# Patient Record
Sex: Female | Born: 1990 | Race: Black or African American | Hispanic: No | Marital: Single | Smoking: Current every day smoker
Health system: Southern US, Community
[De-identification: ages and names within clinical notes are randomized; demographics above are authoritative.]

---

## 2017-06-17 ENCOUNTER — Other Ambulatory Visit: Payer: Self-pay

## 2017-06-17 ENCOUNTER — Encounter (HOSPITAL_COMMUNITY): Payer: Self-pay | Admitting: Emergency Medicine

## 2017-06-17 ENCOUNTER — Emergency Department (HOSPITAL_COMMUNITY): Payer: Self-pay

## 2017-06-17 ENCOUNTER — Emergency Department (HOSPITAL_COMMUNITY)
Admission: EM | Admit: 2017-06-17 | Discharge: 2017-06-17 | Disposition: A | Discharge status: Home / Self Care | Payer: Self-pay | Attending: Emergency Medicine | Admitting: Emergency Medicine

## 2017-06-17 DIAGNOSIS — R102 Pelvic and perineal pain: Secondary | ICD-10-CM | POA: Insufficient documentation

## 2017-06-17 DIAGNOSIS — N83201 Unspecified ovarian cyst, right side: Secondary | ICD-10-CM | POA: Insufficient documentation

## 2017-06-17 DIAGNOSIS — M25551 Pain in right hip: Secondary | ICD-10-CM | POA: Insufficient documentation

## 2017-06-17 DIAGNOSIS — F17210 Nicotine dependence, cigarettes, uncomplicated: Secondary | ICD-10-CM | POA: Insufficient documentation

## 2017-06-17 LAB — WET PREP, GENITAL
SPERM: NONE SEEN
Trich, Wet Prep: NONE SEEN
Yeast Wet Prep HPF POC: NONE SEEN

## 2017-06-17 LAB — URINALYSIS, ROUTINE W REFLEX MICROSCOPIC
BILIRUBIN URINE: NEGATIVE
GLUCOSE, UA: NEGATIVE mg/dL
HGB URINE DIPSTICK: NEGATIVE
Ketones, ur: NEGATIVE mg/dL
Leukocytes, UA: NEGATIVE
Nitrite: NEGATIVE
PH: 6 (ref 5.0–8.0)
Protein, ur: NEGATIVE mg/dL
Specific Gravity, Urine: 1.013 (ref 1.005–1.030)

## 2017-06-17 LAB — CBC WITH DIFFERENTIAL/PLATELET
BASOS ABS: 0.1 10*3/uL (ref 0.0–0.1)
BASOS PCT: 1 %
Eosinophils Absolute: 0.3 10*3/uL (ref 0.0–0.7)
Eosinophils Relative: 4 %
HEMATOCRIT: 37.9 % (ref 36.0–46.0)
HEMOGLOBIN: 12.1 g/dL (ref 12.0–15.0)
LYMPHS PCT: 37 %
Lymphs Abs: 2.5 10*3/uL (ref 0.7–4.0)
MCH: 25.7 pg — ABNORMAL LOW (ref 26.0–34.0)
MCHC: 31.9 g/dL (ref 30.0–36.0)
MCV: 80.6 fL (ref 78.0–100.0)
MONO ABS: 0.6 10*3/uL (ref 0.1–1.0)
Monocytes Relative: 9 %
NEUTROS ABS: 3.2 10*3/uL (ref 1.7–7.7)
NEUTROS PCT: 49 %
Platelets: 181 10*3/uL (ref 150–400)
RBC: 4.7 MIL/uL (ref 3.87–5.11)
RDW: 13.7 % (ref 11.5–15.5)
WBC: 6.6 10*3/uL (ref 4.0–10.5)

## 2017-06-17 LAB — I-STAT CHEM 8, ED
BUN: 7 mg/dL (ref 6–20)
CHLORIDE: 101 mmol/L (ref 101–111)
Calcium, Ion: 1.17 mmol/L (ref 1.15–1.40)
Creatinine, Ser: 0.6 mg/dL (ref 0.44–1.00)
Glucose, Bld: 74 mg/dL (ref 65–99)
HEMATOCRIT: 39 % (ref 36.0–46.0)
HEMOGLOBIN: 13.3 g/dL (ref 12.0–15.0)
POTASSIUM: 3.6 mmol/L (ref 3.5–5.1)
Sodium: 139 mmol/L (ref 135–145)
TCO2: 24 mmol/L (ref 22–32)

## 2017-06-17 LAB — POC URINE PREG, ED: Preg Test, Ur: NEGATIVE

## 2017-06-17 MED ORDER — IBUPROFEN 200 MG PO TABS
600.0000 mg | ORAL_TABLET | Freq: Once | ORAL | Status: AC
  Administered 2017-06-17: 600 mg via ORAL
  Filled 2017-06-17: qty 3

## 2017-06-17 MED ORDER — IBUPROFEN 600 MG PO TABS: 600.0000 mg | ORAL_TABLET | Freq: Four times a day (QID) | ORAL | 0 refills | Status: AC | PRN

## 2017-06-17 NOTE — ED Provider Notes (Signed)
[facility]-EMERGENCY DEPT Provider Note   CSN: [id number] Arrival date & time: 06/17/17  1546     History   Chief Complaint Chief Complaint  Patient presents with  . Hip Pain  . Pelvic Pain    HPI Sarah York is a 27 y.o. female.  HPI   Generally healthy 27 year old female presenting for evaluation of right hip pain.  Patient report for the past several days she has noticed pain to her right hip.  Pain worsening today after she got up.  She described pain as a sharp throbbing sensation, worsening with movement and with walking.  Pain is moderate in severity and nonradiating.  She report sleeping on her sister's couch for the past 2 months and think it may have caused her hip pain.  She denies any specific injury.  Denies any fever, chills, lightheadedness, dizziness, abdominal pain, dysuria, hematuria, vaginal bleeding or vaginal discharge.  She denies any focal numbness or weakness or saddle anesthesia.  She is not pregnant.  She denies any specific treatment tried.  She reports ambulating with a limp.  She denies noting rash  History reviewed. No pertinent past medical history.  There are no active problems to display for this patient.   History reviewed. No pertinent surgical history.   OB History   None      Home Medications    Prior to Admission medications   Not on File    Family History History reviewed. No pertinent family history.  Social History Social History   Tobacco Use  . Smoking status: Current Every Day Smoker    Packs/day: 0.50    Types: Cigarettes  . Smokeless tobacco: Never Used  Substance Use Topics  . Alcohol use: Never    Frequency: Never  . Drug use: Never     Allergies   Patient has no known allergies.   Review of Systems Review of Systems  Constitutional: Negative for fever.  Musculoskeletal: Positive for arthralgias.  Skin: Negative for rash and wound.  Neurological: Negative for numbness.      Physical Exam Updated Vital Signs BP (!) 123/96 (BP Location: Left Arm)   Pulse 87   Temp 97.8 F (36.6 C) (Oral)   Resp 16   LMP 05/28/2017 (Approximate)   SpO2 100%   Physical Exam  Constitutional: She appears well-developed and well-nourished. No distress.  HENT:  Head: Atraumatic.  Eyes: Conjunctivae are normal.  Neck: Neck supple.  Cardiovascular: Normal rate and regular rhythm.  Pulmonary/Chest: Effort normal and breath sounds normal.  Abdominal: Soft. Bowel sounds are normal. She exhibits no distension. There is no tenderness.  Genitourinary:  Genitourinary Comments: Chaperone present during exam. No inguinal lymphadenopathy or hernia noted. Normal external genitalia. Discomfort with speculum insertion.  Moderate amount of vaginal discharge noted in vaginal vault.  Cervical os closed. On bimanual exam, tenderness to R adnexal region.  No CMT.  Musculoskeletal: She exhibits tenderness (Right hip: Tenderness to the greater trochanter and lateral hip on palpation without any obvious deformity, abnormal leg rotation or shortening of the leg.  Hip with full range of motion but with tenderness to movement.  No overlying skin changes.).  No tenderness to midline spine, or right knee.  Neurological: She is alert.  Skin: No rash noted.  Psychiatric: She has a normal mood and affect.  Nursing note and vitals reviewed.    ED Treatments / Results  Labs (all labs ordered are listed, but only abnormal results are displayed) Labs Reviewed  URINALYSIS, ROUTINE W REFLEX MICROSCOPIC  POC URINE PREG, ED    EKG None  Radiology US Transvaginal Non-ob  Result Date: 06/17/2017 CLINICAL DATA:  Right pelvic pain.  Last menstrual period 05/28/2017 EXAM: TRANSABDOMINAL AND TRANSVAGINAL ULTRASOUND OF PELVIS DOPPLER ULTRASOUND OF OVARIES TECHNIQUE: Both transabdominal and transvaginal ultrasound examinations of the pelvis were performed. Transabdominal technique was performed for global  imaging of the pelvis including uterus, ovaries, adnexal regions, and pelvic cul-de-sac. It was necessary to proceed with endovaginal exam following the transabdominal exam to visualize the endometrium and adnexa. Color and duplex Doppler ultrasound was utilized to evaluate blood flow to the ovaries. COMPARISON:  None. FINDINGS: Uterus Measurements: 9.0 x 5.1 x 6.0 cm. No fibroids or other mass visualized. Endometrium Thickness: 16 mm. 3 mm anechoic circumscribed structure noted within the endometrium. Right ovary Measurements: 3.3 x 2.6 x 2.9 cm. There is a 2.3 cm echogenic area with associated cystic component and peripheral vascularity. Left ovary Measurements: 3.2 x 1.2 x 2.0 cm. Normal appearance/no adnexal mass. Pulsed Doppler evaluation of both ovaries demonstrates normal low-resistance arterial and venous waveforms. Other findings No abnormal free fluid. IMPRESSION: Endometrium upper limits of normal in thickness with 3 mm anechoic structure within it. With negative pregnancy test this likely represents a small cyst within the endometrium. Normal appearance of the left ovary. 2.3 cm mixed echogenicity mass on the right ovary with peripheral vascularity. This may represent a degenerating corpus luteal cyst. Follow-up ultrasound in 6-8 weeks is recommended, assuming negative pregnancy test and no chance of ectopic pregnancy. Electronically Signed   By: Ted Mcalpine M.D.   On: 06/17/2017 21:00   US Pelvis Complete  Result Date: 06/17/2017 CLINICAL DATA:  Right pelvic pain.  Last menstrual period 05/28/2017 EXAM: TRANSABDOMINAL AND TRANSVAGINAL ULTRASOUND OF PELVIS DOPPLER ULTRASOUND OF OVARIES TECHNIQUE: Both transabdominal and transvaginal ultrasound examinations of the pelvis were performed. Transabdominal technique was performed for global imaging of the pelvis including uterus, ovaries, adnexal regions, and pelvic cul-de-sac. It was necessary to proceed with endovaginal exam following the  transabdominal exam to visualize the endometrium and adnexa. Color and duplex Doppler ultrasound was utilized to evaluate blood flow to the ovaries. COMPARISON:  None. FINDINGS: Uterus Measurements: 9.0 x 5.1 x 6.0 cm. No fibroids or other mass visualized. Endometrium Thickness: 16 mm. 3 mm anechoic circumscribed structure noted within the endometrium. Right ovary Measurements: 3.3 x 2.6 x 2.9 cm. There is a 2.3 cm echogenic area with associated cystic component and peripheral vascularity. Left ovary Measurements: 3.2 x 1.2 x 2.0 cm. Normal appearance/no adnexal mass. Pulsed Doppler evaluation of both ovaries demonstrates normal low-resistance arterial and venous waveforms. Other findings No abnormal free fluid. IMPRESSION: Endometrium upper limits of normal in thickness with 3 mm anechoic structure within it. With negative pregnancy test this likely represents a small cyst within the endometrium. Normal appearance of the left ovary. 2.3 cm mixed echogenicity mass on the right ovary with peripheral vascularity. This may represent a degenerating corpus luteal cyst. Follow-up ultrasound in 6-8 weeks is recommended, assuming negative pregnancy test and no chance of ectopic pregnancy. Electronically Signed   By: Ted Mcalpine M.D.   On: 06/17/2017 21:00   [place] Art/ven Flow Abd Pelv Doppler  Result Date: 06/17/2017 CLINICAL DATA:  Right pelvic pain.  Last menstrual period 05/28/2017 EXAM: TRANSABDOMINAL AND TRANSVAGINAL ULTRASOUND OF PELVIS DOPPLER ULTRASOUND OF OVARIES TECHNIQUE: Both transabdominal and transvaginal ultrasound examinations of the pelvis were performed. Transabdominal technique was performed for global imaging of the  pelvis including uterus, ovaries, adnexal regions, and pelvic cul-de-sac. It was necessary to proceed with endovaginal exam following the transabdominal exam to visualize the endometrium and adnexa. Color and duplex Doppler ultrasound was utilized to evaluate blood flow to the  ovaries. COMPARISON:  None. FINDINGS: Uterus Measurements: 9.0 x 5.1 x 6.0 cm. No fibroids or other mass visualized. Endometrium Thickness: 16 mm. 3 mm anechoic circumscribed structure noted within the endometrium. Right ovary Measurements: 3.3 x 2.6 x 2.9 cm. There is a 2.3 cm echogenic area with associated cystic component and peripheral vascularity. Left ovary Measurements: 3.2 x 1.2 x 2.0 cm. Normal appearance/no adnexal mass. Pulsed Doppler evaluation of both ovaries demonstrates normal low-resistance arterial and venous waveforms. Other findings No abnormal free fluid. IMPRESSION: Endometrium upper limits of normal in thickness with 3 mm anechoic structure within it. With negative pregnancy test this likely represents a small cyst within the endometrium. Normal appearance of the left ovary. 2.3 cm mixed echogenicity mass on the right ovary with peripheral vascularity. This may represent a degenerating corpus luteal cyst. Follow-up ultrasound in 6-8 weeks is recommended, assuming negative pregnancy test and no chance of ectopic pregnancy. Electronically Signed   By: Ted Mcalpine M.D.   On: 06/17/2017 21:00    Procedures Procedures (including critical care time)  Medications Ordered in ED Medications  ibuprofen (ADVIL,MOTRIN) tablet 600 mg (600 mg Oral Given 06/17/17 1741)     Initial Impression / Assessment and Plan / ED Course  I have reviewed the triage vital signs and the nursing notes.  Pertinent labs & imaging results that were available during my care of the patient were reviewed by me and considered in my medical decision making (see chart for details).     BP 104/74 (BP Location: Left Arm)   Pulse 66   Temp 97.8 F (36.6 C) (Oral)   Resp 18   LMP 05/28/2017 (Approximate)   SpO2 100%    Final Clinical Impressions(s) / ED Diagnoses   Final diagnoses:  Right hip pain  Ovarian cyst, right    ED Discharge Orders        Ordered    ibuprofen (ADVIL,MOTRIN) 600 MG  tablet  Every 6 hours PRN     06/17/17 2126     5:22 PM Patient complaining of right hip pain after sleeping on his sister's couch for the past 2 months.  Pain is reproducible on exam.  No recent injury to suggest fracture or dislocation.  No signs of infection.  6:24 PM Pt received ibuprofen and report improvement.  preg test negative, UA negative, however on reexamination pt has pain to her pelvic region.  Will perform pelvic examination to r/o pelvic pathology.  Pt denies fever, chills, n/v.  6:47 PM R adnexal tenderness on pelvic examination. Moderate vaginal discharge noted.  No CMT.  Will obtain pelvic US to r/o torsion or TOA.   9:20 PM Wet prep shows present of clue cells but without vaginal complaint, I do not think we need to treat for BV.  Normal WBC, preg test negative and UA negative. Pelvic US showing 2.3 cm mixed echogenicity mass on the R ovary with peripheral vascularity.  THis is likely a degenerating corpus luteal cyst.  FOllow up ultrasound in 6-8 weeks is recommended.  I discussed this finding with pt.  Recommend return sooner if no improvement of her symptoms.    Pt is doing much better after receiving ibuprofen and she feels comfortable being discharge.     Laveda Norman,  Kelton Pillar 06/17/17 2128    Wynetta Fines, MD 06/17/17 2138

## 2017-06-17 NOTE — Discharge Instructions (Signed)
You have been evaluated for your right hip pain.  Take ibuprofen as needed for pain.  You have evidence of a corpus luteal cyst on the right ovary.  Please have a repeat ultrasound in 6-8 weeks.  If your condition worsen, please don't hesitate to return to the ER for further evaluation.

## 2017-06-17 NOTE — ED Triage Notes (Signed)
Pt states that she has been sleeping on her sister's couch for 2 months.  States that this morning, she woke up with rt hip pain.  Worse with movement.  Ambulatory with limp to triage room.

## 2017-06-18 LAB — GC/CHLAMYDIA PROBE AMP (~~LOC~~) NOT AT ARMC
Chlamydia: NEGATIVE
NEISSERIA GONORRHEA: NEGATIVE

## 2017-06-18 LAB — RPR: RPR: NONREACTIVE

## 2017-06-18 LAB — HIV ANTIBODY (ROUTINE TESTING W REFLEX): HIV SCREEN 4TH GENERATION: NONREACTIVE

## 2019-01-19 ENCOUNTER — Emergency Department (HOSPITAL_COMMUNITY): Payer: Medicaid Other

## 2019-01-19 ENCOUNTER — Other Ambulatory Visit: Payer: Self-pay

## 2019-01-19 ENCOUNTER — Emergency Department (HOSPITAL_COMMUNITY)
Admission: EM | Admit: 2019-01-19 | Discharge: 2019-01-19 | Disposition: A | Discharge status: Home / Self Care | Payer: Medicaid Other | Attending: Emergency Medicine | Admitting: Emergency Medicine

## 2019-01-19 ENCOUNTER — Encounter (HOSPITAL_COMMUNITY): Payer: Self-pay | Admitting: Emergency Medicine

## 2019-01-19 DIAGNOSIS — Y999 Unspecified external cause status: Secondary | ICD-10-CM | POA: Insufficient documentation

## 2019-01-19 DIAGNOSIS — S8265XA Nondisplaced fracture of lateral malleolus of left fibula, initial encounter for closed fracture: Secondary | ICD-10-CM | POA: Insufficient documentation

## 2019-01-19 DIAGNOSIS — Y929 Unspecified place or not applicable: Secondary | ICD-10-CM | POA: Diagnosis not present

## 2019-01-19 DIAGNOSIS — S99912A Unspecified injury of left ankle, initial encounter: Secondary | ICD-10-CM | POA: Diagnosis present

## 2019-01-19 DIAGNOSIS — Y939 Activity, unspecified: Secondary | ICD-10-CM | POA: Diagnosis not present

## 2019-01-19 DIAGNOSIS — W1830XA Fall on same level, unspecified, initial encounter: Secondary | ICD-10-CM | POA: Diagnosis not present

## 2019-01-19 MED ORDER — TRAMADOL HCL 50 MG PO TABS: 50.0000 mg | ORAL_TABLET | Freq: Four times a day (QID) | ORAL | 0 refills | Status: AC | PRN

## 2019-01-19 MED ORDER — OXYCODONE-ACETAMINOPHEN 5-325 MG PO TABS
1.0000 | ORAL_TABLET | Freq: Once | ORAL | Status: AC
  Administered 2019-01-19: 05:00:00 1 via ORAL
  Filled 2019-01-19: qty 1

## 2019-01-19 MED ORDER — NAPROXEN 250 MG PO TABS
500.0000 mg | ORAL_TABLET | Freq: Once | ORAL | Status: AC
Start: 2019-01-19 — End: 2019-01-19
  Administered 2019-01-19: 500 mg via ORAL
  Filled 2019-01-19: qty 2

## 2019-01-19 MED ORDER — OXYCODONE-ACETAMINOPHEN 5-325 MG PO TABS
1.0000 | ORAL_TABLET | Freq: Once | ORAL | Status: AC
  Administered 2019-01-19: 1 via ORAL
  Filled 2019-01-19: qty 1
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
## 2019-01-19 NOTE — ED Notes (Signed)
Discharge instructions discussed with pt. Pt verbalized understanding. Pt stable and ambulatory. No signature pad available. 

## 2019-01-19 NOTE — Discharge Instructions (Addendum)
You may remove your walking boot to shower, but this should otherwise remain on at all times to ensure proper stabilization of your broken bone. Use crutches when walking to prevent from putting weight on your left foot/leg if this causes you too much pain. Take 600mg  ibuprofen every 6 hours for pain.  You may use Tramadol for severe pain as prescribed.  Do not drive or drink alcohol after taking this medication as it may make you drowsy and impair your judgment.  Any broken bone takes approximately 6 to 8 weeks to completely heal.  You should follow-up with orthopedics to ensure proper healing of your broken bone.  Call in the morning to schedule a follow-up appointment with Dr. Marlou Sa of orthopedics.  You may return to the ED for any new or concerning symptoms.
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
## 2019-01-19 NOTE — Progress Notes (Signed)
Orthopedic Tech Progress Note Patient Details:  Khaniyah Bezek [birth year] [id number]  Ortho Devices Type of Ortho Device: Crutches, CAM walker Ortho Device/Splint Location: LLE Ortho Device/Splint Interventions: Ordered, Application   Post Interventions Patient Tolerated: Well Instructions Provided: Adjustment of device, Care of device, Poper ambulation with device   Staci Righter 01/19/2019, 5:26 AM

## 2019-01-19 NOTE — ED Notes (Signed)
Pt yelling in lobby and stating
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
## 2019-01-19 NOTE — ED Notes (Signed)
Ortho tech at bedside ambulating pt with crutches

## 2019-01-19 NOTE — ED Notes (Signed)
Tech had to remove the pt from the waiting room box in lobby set aside for the respiratory pt's. Informed pt again that she needed to stay put

## 2019-02-08 ENCOUNTER — Other Ambulatory Visit: Payer: Self-pay

## 2019-02-08 ENCOUNTER — Ambulatory Visit (INDEPENDENT_AMBULATORY_CARE_PROVIDER_SITE_OTHER): Payer: Medicaid Other

## 2019-02-08 ENCOUNTER — Encounter: Payer: Self-pay | Admitting: Orthopaedic Surgery

## 2019-02-08 ENCOUNTER — Ambulatory Visit (INDEPENDENT_AMBULATORY_CARE_PROVIDER_SITE_OTHER): Payer: Medicaid Other | Admitting: Orthopaedic Surgery

## 2019-02-08 DIAGNOSIS — S82892A Other fracture of left lower leg, initial encounter for closed fracture: Secondary | ICD-10-CM | POA: Diagnosis not present
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
## 2019-08-20 IMAGING — US US ART/VEN ABD/PELV/SCROTUM DOPPLER LTD
1 series · 13 of 25 positions shown · non-contrast
Comparison: None.

CLINICAL DATA: Right pelvic pain.  Last menstrual period 05/28/2017

EXAM:
TRANSABDOMINAL AND TRANSVAGINAL ULTRASOUND OF PELVIS
DOPPLER ULTRASOUND OF OVARIES
TECHNIQUE: Both transabdominal and transvaginal ultrasound examinations of the
pelvis were performed. Transabdominal technique was performed for
global imaging of the pelvis including uterus, ovaries, adnexal
regions, and pelvic cul-de-sac.
It was necessary to proceed with endovaginal exam following the
transabdominal exam to visualize the endometrium and adnexa. Color
and duplex Doppler ultrasound was utilized to evaluate blood flow to
the ovaries.

[Series 1: us art/ven abd/pelv/scrotum doppler ltd · 104 acquisitions, 13 frames shown]
[im 1/104]
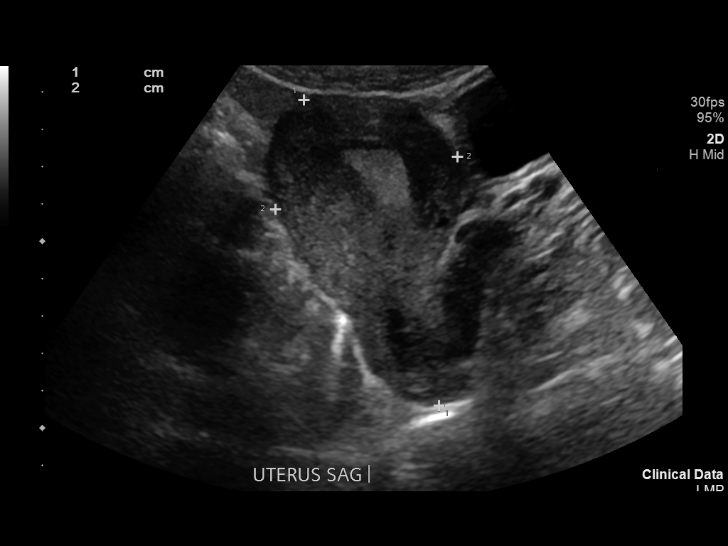
[im 9/104]
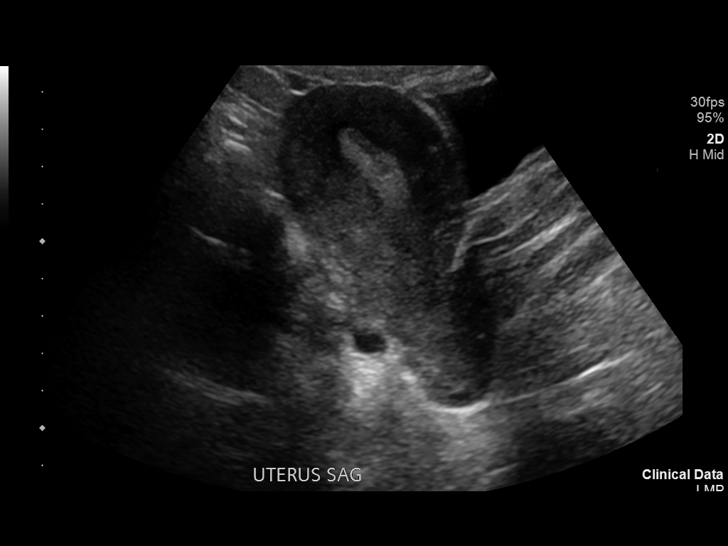
[im 18/104]
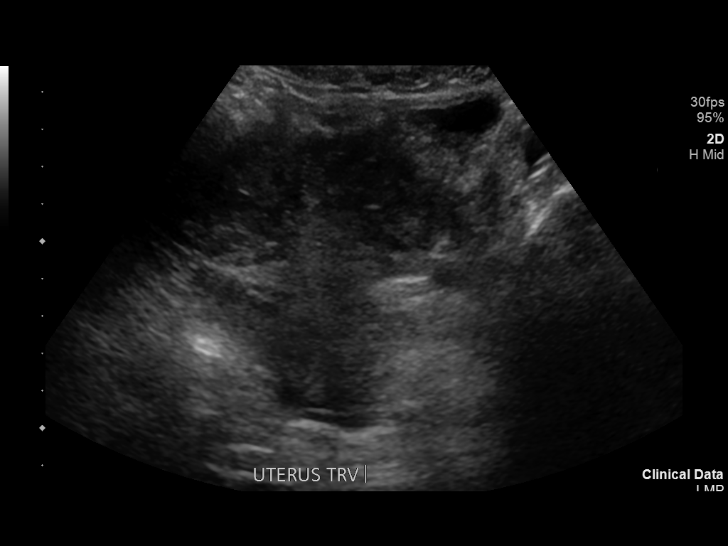
[im 26/104]
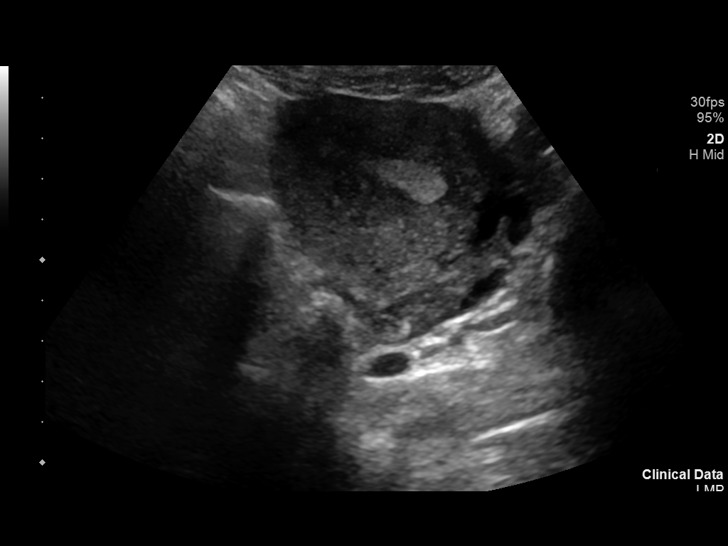
[im 35/104]
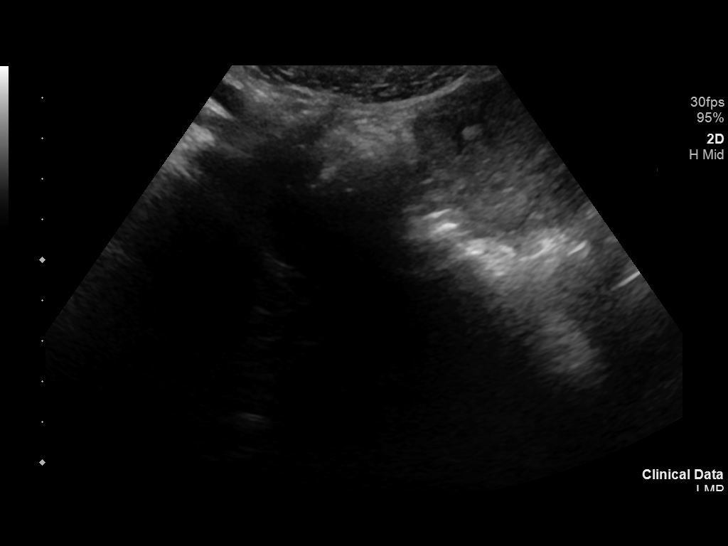
[im 43/104]
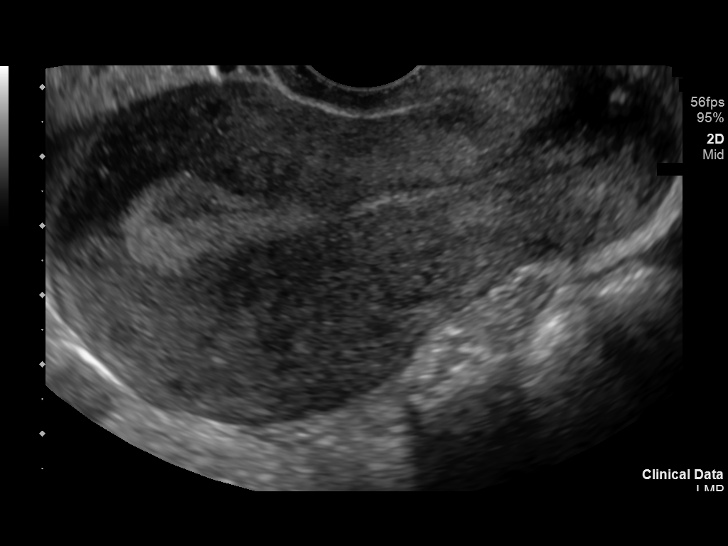
[im 52/104]
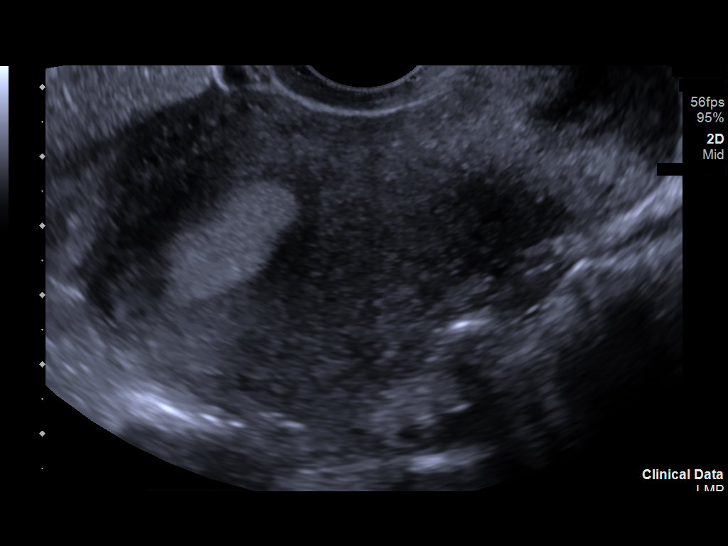
[im 61/104]
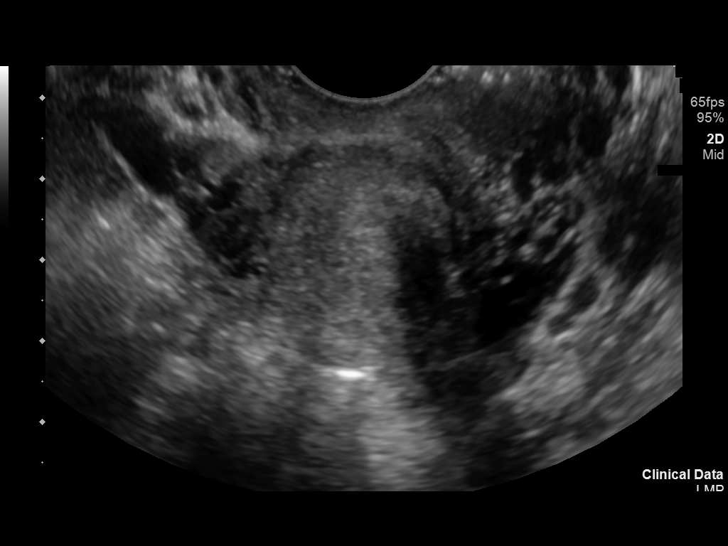
[im 69/104]
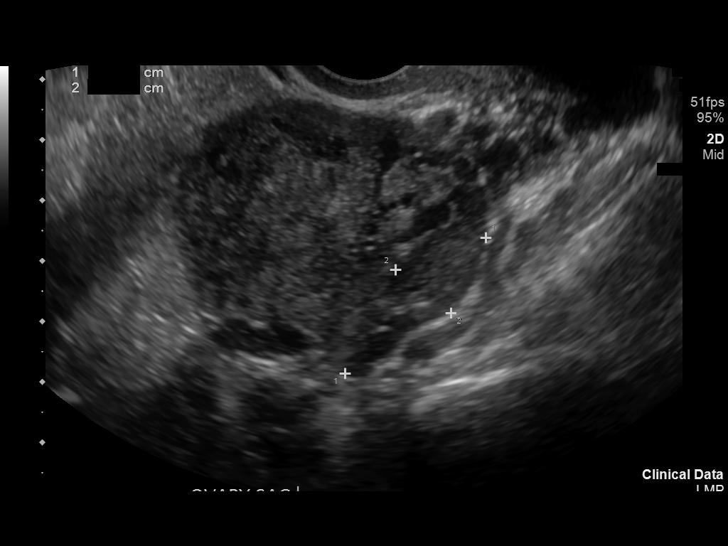
[im 78/104]
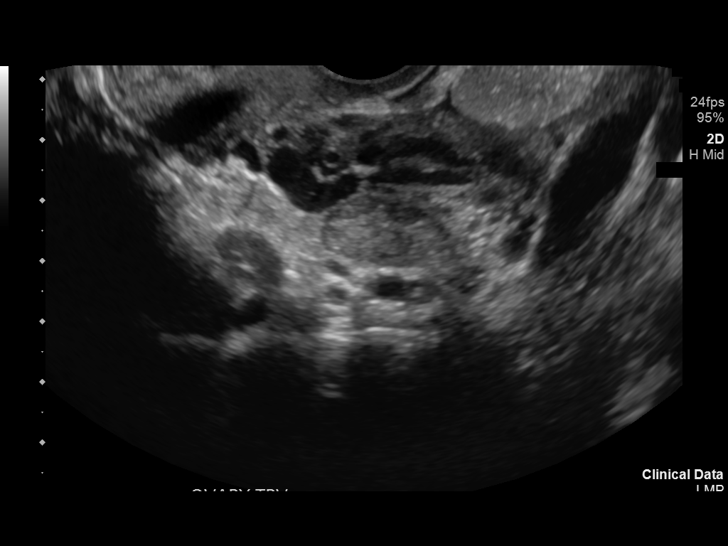
[im 86/104]
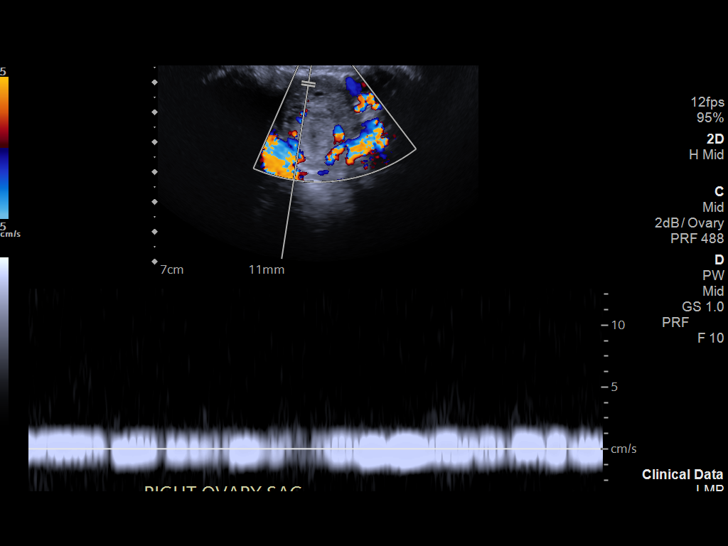
[im 95/104]
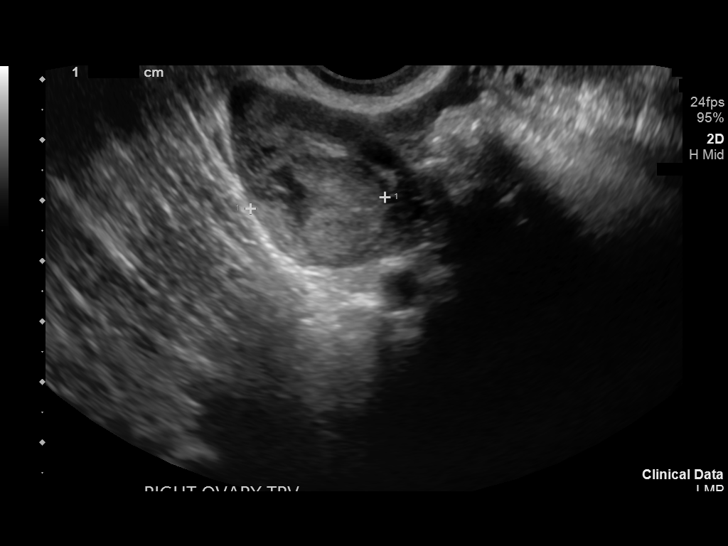
[im 104/104]
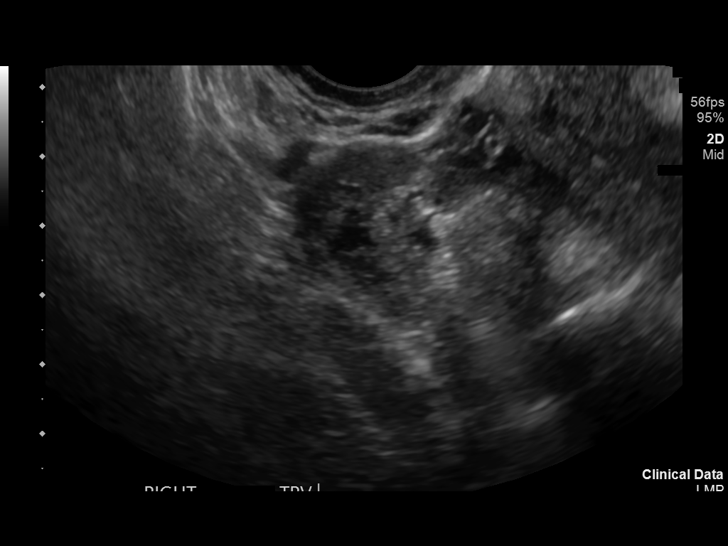

[13 of 25 positions shown; findings below may reference images not displayed]

FINDINGS: Uterus

Measurements: 9.0 x 5.1 x 6.0 cm. No fibroids or other mass
visualized.

Endometrium

Thickness: 16 mm. 3 mm anechoic circumscribed structure noted within
the endometrium.

Right ovary

Measurements: 3.3 x 2.6 x 2.9 cm. There is a 2.3 cm echogenic area
with associated cystic component and peripheral vascularity.

Left ovary

Measurements: 3.2 x 1.2 x 2.0 cm. Normal appearance/no adnexal mass.

Pulsed Doppler evaluation of both ovaries demonstrates normal
low-resistance arterial and venous waveforms.

Other findings

No abnormal free fluid.
IMPRESSION: Endometrium upper limits of normal in thickness with 3 mm anechoic
structure within it. With negative pregnancy test this likely
represents a small cyst within the endometrium.

Normal appearance of the left ovary.

2.3 cm mixed echogenicity mass on the right ovary with peripheral
vascularity. This may represent a degenerating corpus luteal cyst.
Follow-up ultrasound in 6-8 weeks is recommended, assuming negative
pregnancy test and no chance of ectopic pregnancy.

## 2021-03-23 IMAGING — DX DG ANKLE COMPLETE 3+V*L*
3 series · 3 of 3 positions shown · non-contrast
Comparison: None.

CLINICAL DATA: Fall injury

EXAM:
LEFT ANKLE COMPLETE - 3+ VIEW

[ankle ap]
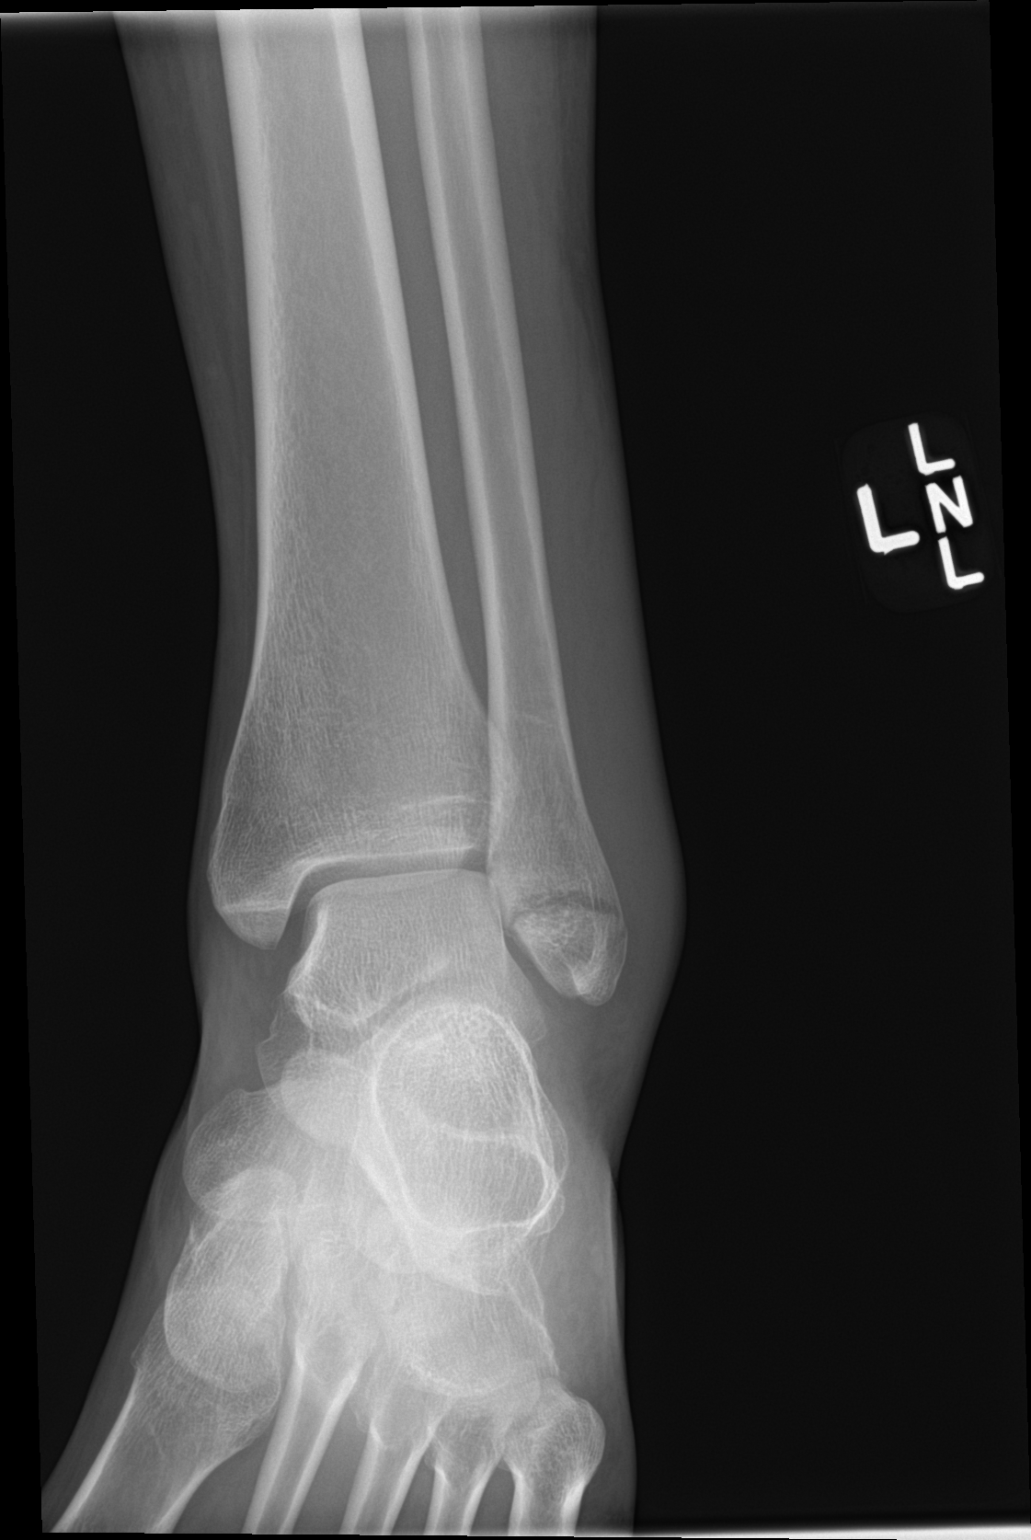

[ankle obl]
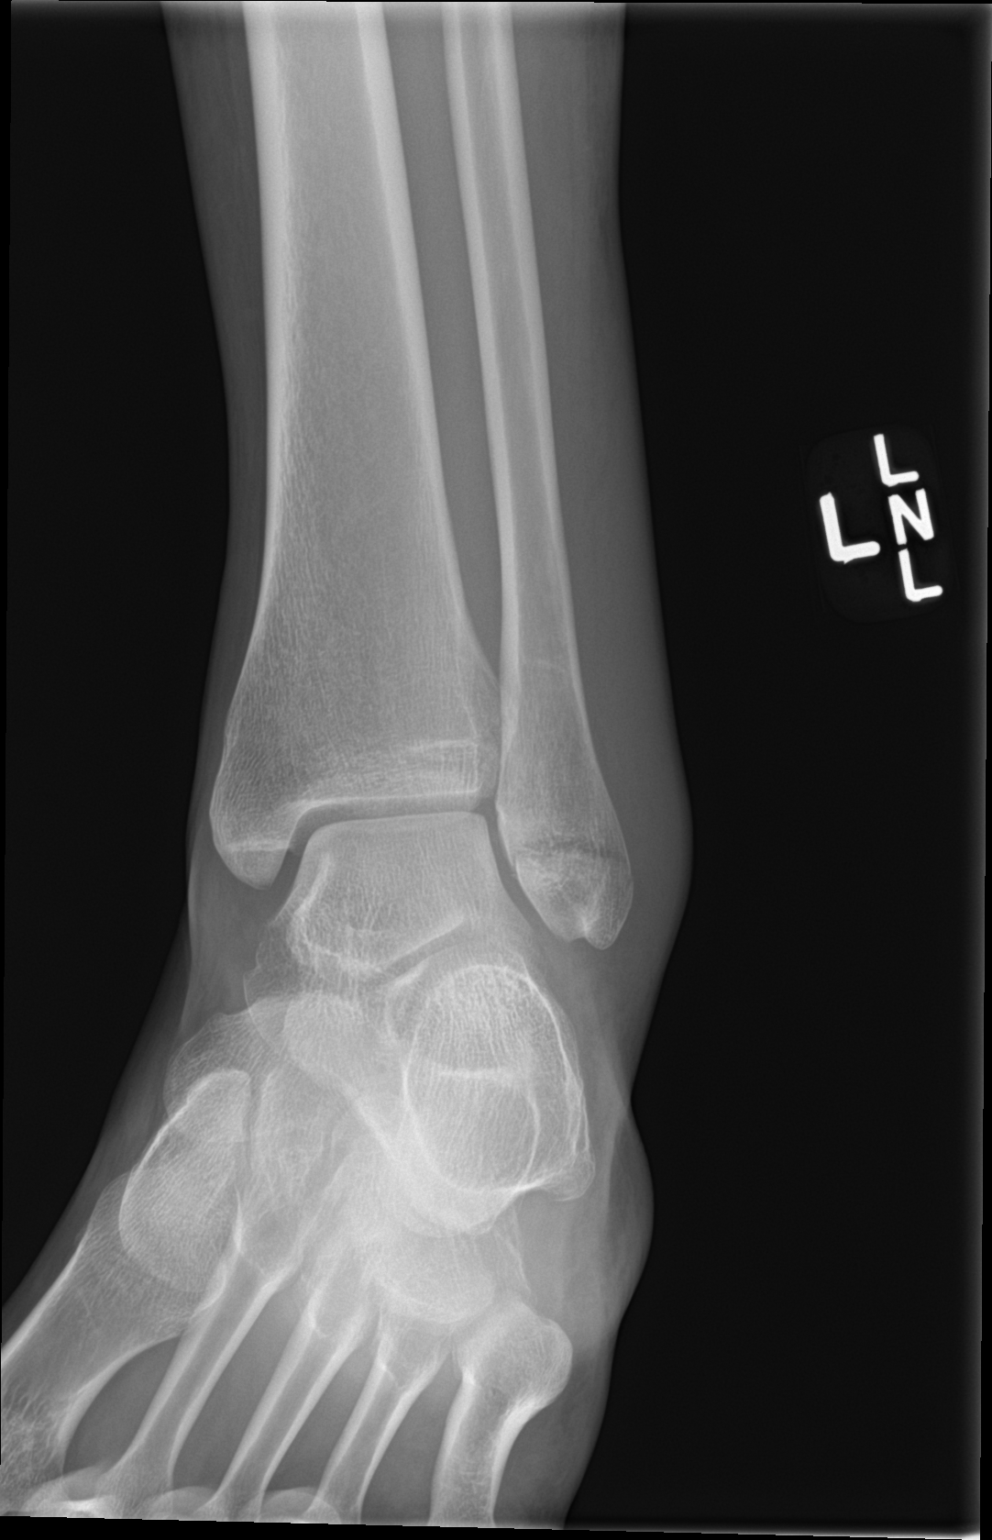

[ankle lat]
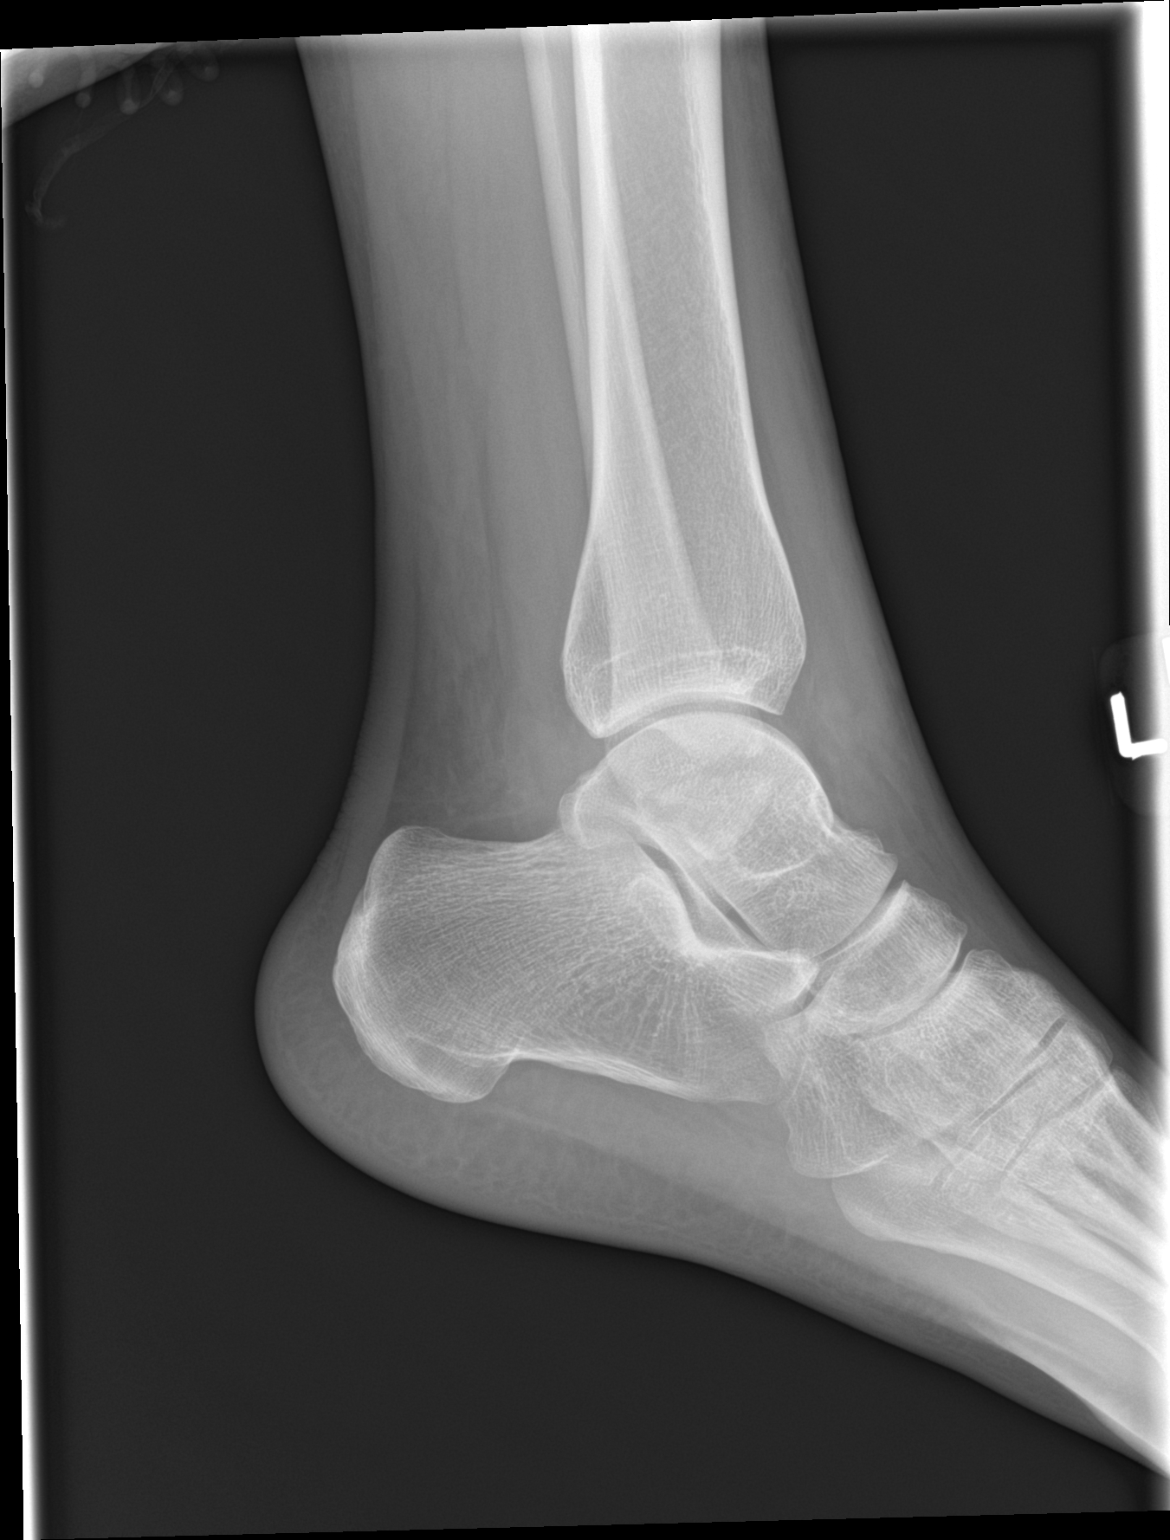

[3 of 3 positions shown; findings below may reference images not displayed]

FINDINGS: There is a nondisplaced lateral malleolar fracture. The ankle
mortise appears congruent. Diffuse soft tissue swelling seen over
the lateral malleolus with a small ankle joint effusion.
IMPRESSION: Nondisplaced lateral malleolar fracture.
